# Patient Record
Sex: Female | Born: 1935 | Race: Asian | Hispanic: No | Marital: Single | Smoking: Never smoker
Health system: Southern US, Community
[De-identification: ages and names within clinical notes are randomized; demographics above are authoritative.]

## PROBLEM LIST (undated history)

## (undated) DIAGNOSIS — G629 Polyneuropathy, unspecified: Secondary | ICD-10-CM

## (undated) DIAGNOSIS — F329 Major depressive disorder, single episode, unspecified: Secondary | ICD-10-CM

## (undated) DIAGNOSIS — J45909 Unspecified asthma, uncomplicated: Secondary | ICD-10-CM

## (undated) DIAGNOSIS — J189 Pneumonia, unspecified organism: Secondary | ICD-10-CM

## (undated) DIAGNOSIS — K5900 Constipation, unspecified: Secondary | ICD-10-CM

## (undated) DIAGNOSIS — F32A Depression, unspecified: Secondary | ICD-10-CM

## (undated) DIAGNOSIS — H3500 Unspecified background retinopathy: Secondary | ICD-10-CM

## (undated) DIAGNOSIS — H269 Unspecified cataract: Secondary | ICD-10-CM

## (undated) DIAGNOSIS — N289 Disorder of kidney and ureter, unspecified: Secondary | ICD-10-CM

## (undated) DIAGNOSIS — L0390 Cellulitis, unspecified: Secondary | ICD-10-CM

## (undated) DIAGNOSIS — E785 Hyperlipidemia, unspecified: Secondary | ICD-10-CM

## (undated) DIAGNOSIS — N186 End stage renal disease: Secondary | ICD-10-CM

## (undated) DIAGNOSIS — M1990 Unspecified osteoarthritis, unspecified site: Secondary | ICD-10-CM

## (undated) DIAGNOSIS — E119 Type 2 diabetes mellitus without complications: Secondary | ICD-10-CM

## (undated) DIAGNOSIS — K219 Gastro-esophageal reflux disease without esophagitis: Secondary | ICD-10-CM

## (undated) DIAGNOSIS — D649 Anemia, unspecified: Secondary | ICD-10-CM

## (undated) DIAGNOSIS — I10 Essential (primary) hypertension: Secondary | ICD-10-CM

## (undated) HISTORY — PX: EYE SURGERY: SHX253

---

## 2016-01-21 ENCOUNTER — Encounter (HOSPITAL_COMMUNITY): Payer: Self-pay

## 2016-01-21 ENCOUNTER — Emergency Department (HOSPITAL_COMMUNITY)
Admission: EM | Admit: 2016-01-21 | Discharge: 2016-01-21 | Disposition: A | Discharge status: Home / Self Care | Payer: Medicaid Other | Attending: Emergency Medicine | Admitting: Emergency Medicine

## 2016-01-21 ENCOUNTER — Emergency Department (HOSPITAL_COMMUNITY): Payer: Medicaid Other

## 2016-01-21 DIAGNOSIS — E11319 Type 2 diabetes mellitus with unspecified diabetic retinopathy without macular edema: Secondary | ICD-10-CM | POA: Insufficient documentation

## 2016-01-21 DIAGNOSIS — M549 Dorsalgia, unspecified: Secondary | ICD-10-CM

## 2016-01-21 DIAGNOSIS — E1140 Type 2 diabetes mellitus with diabetic neuropathy, unspecified: Secondary | ICD-10-CM | POA: Insufficient documentation

## 2016-01-21 DIAGNOSIS — Z79899 Other long term (current) drug therapy: Secondary | ICD-10-CM | POA: Insufficient documentation

## 2016-01-21 DIAGNOSIS — Z992 Dependence on renal dialysis: Secondary | ICD-10-CM | POA: Insufficient documentation

## 2016-01-21 DIAGNOSIS — I120 Hypertensive chronic kidney disease with stage 5 chronic kidney disease or end stage renal disease: Secondary | ICD-10-CM | POA: Insufficient documentation

## 2016-01-21 DIAGNOSIS — E1122 Type 2 diabetes mellitus with diabetic chronic kidney disease: Secondary | ICD-10-CM | POA: Insufficient documentation

## 2016-01-21 DIAGNOSIS — R51 Headache: Secondary | ICD-10-CM | POA: Insufficient documentation

## 2016-01-21 DIAGNOSIS — R519 Headache, unspecified: Secondary | ICD-10-CM

## 2016-01-21 DIAGNOSIS — I159 Secondary hypertension, unspecified: Secondary | ICD-10-CM

## 2016-01-21 DIAGNOSIS — M542 Cervicalgia: Secondary | ICD-10-CM | POA: Insufficient documentation

## 2016-01-21 DIAGNOSIS — N186 End stage renal disease: Secondary | ICD-10-CM | POA: Insufficient documentation

## 2016-01-21 HISTORY — DX: Type 2 diabetes mellitus without complications: E11.9

## 2016-01-21 HISTORY — DX: Depression, unspecified: F32.A

## 2016-01-21 HISTORY — DX: Major depressive disorder, single episode, unspecified: F32.9

## 2016-01-21 HISTORY — DX: Unspecified asthma, uncomplicated: J45.909

## 2016-01-21 HISTORY — DX: Gastro-esophageal reflux disease without esophagitis: K21.9

## 2016-01-21 HISTORY — DX: Essential (primary) hypertension: I10

## 2016-01-21 HISTORY — DX: Unspecified cataract: H26.9

## 2016-01-21 HISTORY — DX: Hyperlipidemia, unspecified: E78.5

## 2016-01-21 HISTORY — DX: Anemia, unspecified: D64.9

## 2016-01-21 HISTORY — DX: Constipation, unspecified: K59.00

## 2016-01-21 HISTORY — DX: Cellulitis, unspecified: L03.90

## 2016-01-21 HISTORY — DX: Unspecified osteoarthritis, unspecified site: M19.90

## 2016-01-21 HISTORY — DX: End stage renal disease: N18.6

## 2016-01-21 HISTORY — DX: Polyneuropathy, unspecified: G62.9

## 2016-01-21 HISTORY — DX: Pneumonia, unspecified organism: J18.9

## 2016-01-21 HISTORY — DX: Disorder of kidney and ureter, unspecified: N28.9

## 2016-01-21 HISTORY — DX: Unspecified background retinopathy: H35.00

## 2016-01-21 LAB — CBC WITH DIFFERENTIAL/PLATELET
Basophils Absolute: 0 10*3/uL (ref 0.0–0.1)
Basophils Relative: 0 %
Eosinophils Absolute: 0.2 10*3/uL (ref 0.0–0.7)
Eosinophils Relative: 3 %
HCT: 28.7 % — ABNORMAL LOW (ref 36.0–46.0)
HEMOGLOBIN: 9.8 g/dL — AB (ref 12.0–15.0)
LYMPHS ABS: 1.2 10*3/uL (ref 0.7–4.0)
LYMPHS PCT: 14 %
MCH: 31.9 pg (ref 26.0–34.0)
MCHC: 34.1 g/dL (ref 30.0–36.0)
MCV: 93.5 fL (ref 78.0–100.0)
Monocytes Absolute: 0.6 10*3/uL (ref 0.1–1.0)
Monocytes Relative: 7 %
NEUTROS PCT: 76 %
Neutro Abs: 6.2 10*3/uL (ref 1.7–7.7)
Platelets: 176 10*3/uL (ref 150–400)
RBC: 3.07 MIL/uL — AB (ref 3.87–5.11)
RDW: 12.4 % (ref 11.5–15.5)
WBC: 8.1 10*3/uL (ref 4.0–10.5)

## 2016-01-21 LAB — COMPREHENSIVE METABOLIC PANEL
ALK PHOS: 85 U/L (ref 38–126)
ALT: 11 U/L — AB (ref 14–54)
AST: 21 U/L (ref 15–41)
Albumin: 3.6 g/dL (ref 3.5–5.0)
Anion gap: 10 (ref 5–15)
BUN: 35 mg/dL — AB (ref 6–20)
CALCIUM: 8.7 mg/dL — AB (ref 8.9–10.3)
CO2: 23 mmol/L (ref 22–32)
CREATININE: 5.53 mg/dL — AB (ref 0.44–1.00)
Chloride: 96 mmol/L — ABNORMAL LOW (ref 101–111)
GFR calc non Af Amer: 7 mL/min — ABNORMAL LOW (ref >60)
GFR, EST AFRICAN AMERICAN: 8 mL/min — AB (ref >60)
GLUCOSE: 124 mg/dL — AB (ref 65–99)
Potassium: 4.9 mmol/L (ref 3.5–5.1)
SODIUM: 129 mmol/L — AB (ref 135–145)
Total Bilirubin: 0.6 mg/dL (ref 0.3–1.2)
Total Protein: 6.1 g/dL — ABNORMAL LOW (ref 6.5–8.1)

## 2016-01-21 MED ORDER — METHOCARBAMOL 500 MG PO TABS: 500.0000 mg | ORAL_TABLET | Freq: Three times a day (TID) | ORAL | Status: DC | PRN

## 2016-01-21 MED ORDER — HYDRALAZINE HCL 20 MG/ML IJ SOLN
10.0000 mg | Freq: Once | INTRAMUSCULAR | Status: AC
  Administered 2016-01-21: 10 mg via INTRAVENOUS
  Filled 2016-01-21: qty 1

## 2016-01-21 MED ORDER — LABETALOL HCL 5 MG/ML IV SOLN
10.0000 mg | Freq: Once | INTRAVENOUS | Status: AC
  Administered 2016-01-21: 10 mg via INTRAVENOUS
  Filled 2016-01-21: qty 4

## 2016-01-21 MED ORDER — ACETAMINOPHEN 325 MG PO TABS
650.0000 mg | ORAL_TABLET | Freq: Once | ORAL | Status: AC
  Administered 2016-01-21: 650 mg via ORAL
  Filled 2016-01-21: qty 2

## 2016-01-21 MED ORDER — METHOCARBAMOL 500 MG PO TABS: 500.0000 mg | ORAL_TABLET | Freq: Three times a day (TID) | ORAL | Status: AC | PRN

## 2016-01-21 MED ORDER — METHOCARBAMOL 500 MG PO TABS
500.0000 mg | ORAL_TABLET | Freq: Once | ORAL | Status: AC
  Administered 2016-01-21: 500 mg via ORAL
  Filled 2016-01-21: qty 1

## 2016-01-21 NOTE — ED Provider Notes (Signed)
CSN: [id number]     Arrival date & time 01/21/16  1236 History   First MD Initiated Contact with Patient 01/21/16 1251     Chief Complaint  Patient presents with  . Hypertension     (Consider location/radiation/quality/duration/timing/severity/associated sxs/prior Treatment) Patient is a 81 y.o. female presenting with back pain.  Back Pain Pain location: paraspinal and paracervical. Quality:  Aching Radiates to:  Does not radiate Pain severity:  Mild Onset quality:  Gradual Chronicity:  New Relieved by:  None tried Worsened by:  Nothing tried Ineffective treatments:  None tried Associated symptoms: no fever     Past Medical History  Diagnosis Date  . Hypertension   . Neuropathy (HCC)   . Anemia   . Arthritis   . GERD (gastroesophageal reflux disease)   . Hyperlipidemia   . Cataract   . Diabetes mellitus without complication (HCC)   . Renal disorder   . ESRD (end stage renal disease) (HCC)   . Constipation   . Depression   . Pneumonia   . Asthma   . Cellulitis   . Retinopathy    Past Surgical History  Procedure Laterality Date  . Eye surgery     History reviewed. No pertinent family history. Social History  Substance Use Topics  . Smoking status: Never Smoker   . Smokeless tobacco: None  . Alcohol Use: None   OB History    No data available     Review of Systems  Constitutional: Negative for fever and chills.  Eyes: Negative for pain.  Musculoskeletal: Positive for myalgias, back pain and neck pain.  All other systems reviewed and are negative.     Allergies  Review of patient's allergies indicates no known allergies.  Home Medications   Prior to Admission medications   Medication Sig Start Date End Date Taking? Authorizing Provider  amLODipine (NORVASC) 10 MG tablet Take 10 mg by mouth daily.    Historical Provider, MD  hydrALAZINE (APRESOLINE) 50 MG tablet Take 100 mg by mouth 3 (three) times daily.    Historical Provider, MD  labetalol  (NORMODYNE) 300 MG tablet Take 300 mg by mouth 2 (two) times daily.    Historical Provider, MD  lisinopril (PRINIVIL,ZESTRIL) 20 MG tablet Take 20 mg by mouth 2 (two) times daily.    Historical Provider, MD  multivitamin (RENA-VIT) TABS tablet Take 1 tablet by mouth daily.    Historical Provider, MD   BP 201/67 mmHg  Pulse 70  Temp(Src) 98.4 F (36.9 C) (Oral)  Resp 21  SpO2 96% Physical Exam  Constitutional: She is oriented to person, place, and time. She appears well-developed and well-nourished.  HENT:  Head: Normocephalic and atraumatic.  Neck: Normal range of motion.  Cardiovascular: Normal rate and regular rhythm.   Pulmonary/Chest: Effort normal. No stridor. No respiratory distress.  Abdominal: Soft. Bowel sounds are normal. She exhibits no distension. There is no tenderness.  Musculoskeletal: She exhibits tenderness (trapezius and paraspinal).  Neurological: She is alert and oriented to person, place, and time.  Skin: Skin is warm and dry. No erythema.  Nursing note and vitals reviewed.   ED Course  Procedures (including critical care time) Labs Review Labs Reviewed  CBC WITH DIFFERENTIAL/PLATELET - Abnormal; Notable for the following:    RBC 3.07 (*)    Hemoglobin 9.8 (*)    HCT 28.7 (*)    All other components within normal limits  COMPREHENSIVE METABOLIC PANEL - Abnormal; Notable for the following:    Sodium 129 (*)  Chloride 96 (*)    Glucose, Bld 124 (*)    BUN 35 (*)    Creatinine, Ser 5.53 (*)    Calcium 8.7 (*)    Total Protein 6.1 (*)    ALT 11 (*)    GFR calc non Af Amer 7 (*)    GFR calc Af Amer 8 (*)    All other components within normal limits    Imaging Review Ct Head Wo Contrast  01/21/2016  CLINICAL DATA:  Posterior headache for 2 days. Evaluate for intracranial hemorrhage EXAM: CT HEAD WITHOUT CONTRAST TECHNIQUE: Contiguous axial images were obtained from the base of the skull through the vertex without intravenous contrast. COMPARISON:   None. FINDINGS: Brain: No evidence of acute infarction, hemorrhage, hydrocephalus, or mass lesion/mass effect. Mild, age congruent chronic microvascular disease seen in the cerebral white matter. Normal cerebral volume for age. Vascular: No hyperdense vessel or unexpected calcification. Skull: Negative for fracture or focal lesion. Sinuses/Orbits: No sinusitis or mastoiditis. Left cataract resection. Other: None. IMPRESSION: Unremarkable head CT for age. Electronically Signed   By: Marnee SpringJonathon  Watts M.D.   On: 01/21/2016 14:06   I have personally reviewed and evaluated these images and lab results as part of my medical decision-making.   EKG Interpretation   Date/Time:  Wednesday January 21 2016 12:39:38 EDT Ventricular Rate:  66 PR Interval:    QRS Duration: 128 QT Interval:  457 QTC Calculation: 479 R Axis:   104 Text Interpretation:  Sinus rhythm RBBB and LPFB no previous tracings to  compare Confirmed by [facility] MD, Barbara CowerJASON [phone] on 01/21/2016 1:16:17 PM      MDM   Final diagnoses:  Neck pain  Bilateral back pain, unspecified location  Nonintractable headache, unspecified chronicity pattern, unspecified headache type  Secondary hypertension, unspecified   After speaking with her through an interpreter, patient had some kind of shot a couple days ago and since then has had a posterior headache with paracervical and upper thoracic paraspinal pain and ttp. BP slightly elevated as well.  Suspect muscular source for her back pain, however ct and labs done as well. Will work on bp and see if that helps as well.  Doubt dissection or acs currently i think it is more muscular.  After treatment patient's headache as 100% resolved. I feel is likely muscular. Still some elevated blood pressures by review of med list it is obvious she has historically difficult to control hypertension and is probably not related to her symptoms here today.  Patient will be discharged to follow-up with dialysis to get  dialysis when needed and will also continue taking her blood pressure medications at home and follow-up with her primary doctor for further control of her blood pressure.  New Prescriptions: New Prescriptions   No medications on file     I have personally and contemperaneously reviewed labs and imaging and used in my decision making as above.   A medical screening exam was performed and I feel the patient has had an appropriate workup for their chief complaint at this time and likelihood of emergent condition existing is low and thus workup can continue on an outpatient basis.. Their vital signs are stable. They have been counseled on decision, discharge, follow up and which symptoms necessitate immediate return to the emergency department.  They verbally stated understanding and agreement with plan and discharged in stable condition.      Marily MemosJason Clarance Bollard, MD 01/21/16 [phone]

## 2016-01-21 NOTE — ED Notes (Signed)
Pt presents with 2 day h/o headache, pt points to back of head to location of pain.  Pt was at dialysis center, did not receive treatment due to blood pressure.

## 2016-01-29 ENCOUNTER — Telehealth: Payer: Self-pay | Admitting: *Deleted

## 2016-01-29 NOTE — ED Notes (Signed)
Per patient request Robaxin 500mg  1 tab q8hrs PRN for muscle spasms, #10 called  [facility] [phone]

## 2017-05-09 IMAGING — CT CT HEAD W/O CM
3 of 4 series · 17 of 47 positions shown, 20 images · non-contrast
Comparison: None.

CLINICAL DATA: Posterior headache for 2 days. Evaluate for
intracranial hemorrhage

EXAM:
CT HEAD WITHOUT CONTRAST
TECHNIQUE: Contiguous axial images were obtained from the base of the skull
through the vertex without intravenous contrast.

[Series 201: head w/o, idose (1) · axial · non-contrast · 0.49mm/px · z∈[+75,+195]mm · 11 of 30 slices shown, 14 images]
[im 3/30  brain]
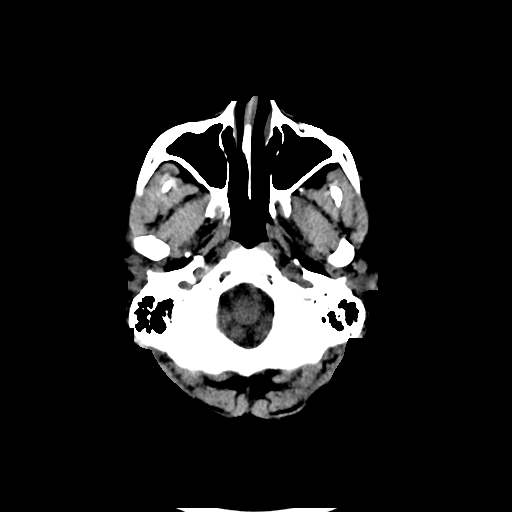
[im 3/30  bone]
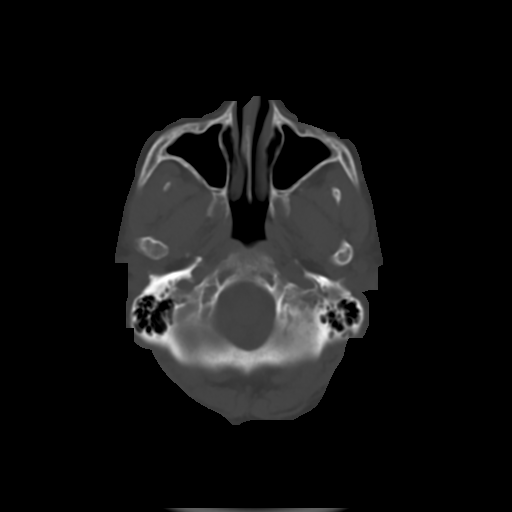
[im 5/30  brain]
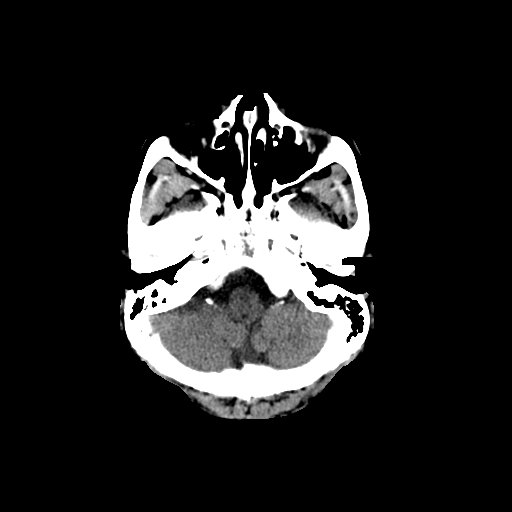
[im 7/30  brain]
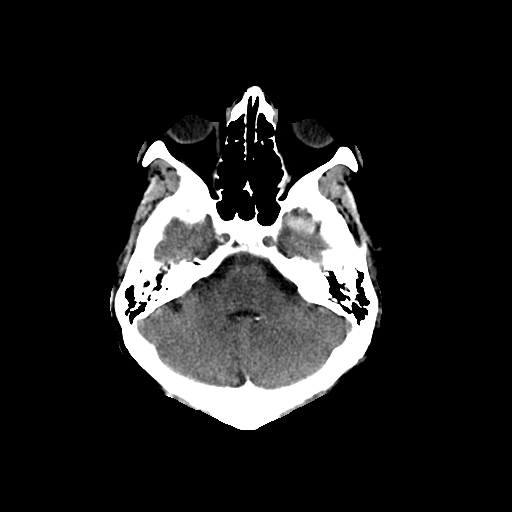
[im 11/30  brain]
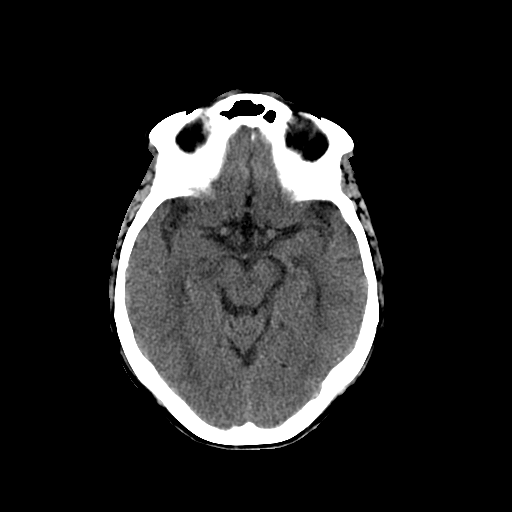
[im 13/30  brain]
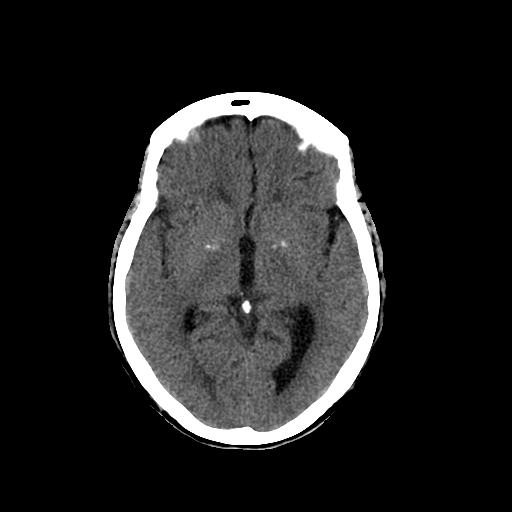
[im 13/30  bone]
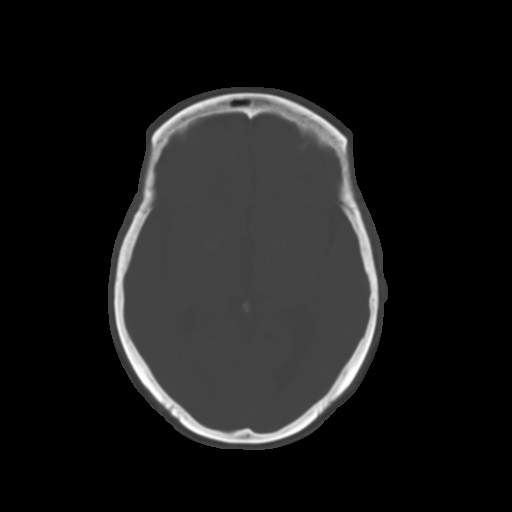
[im 15/30  brain]
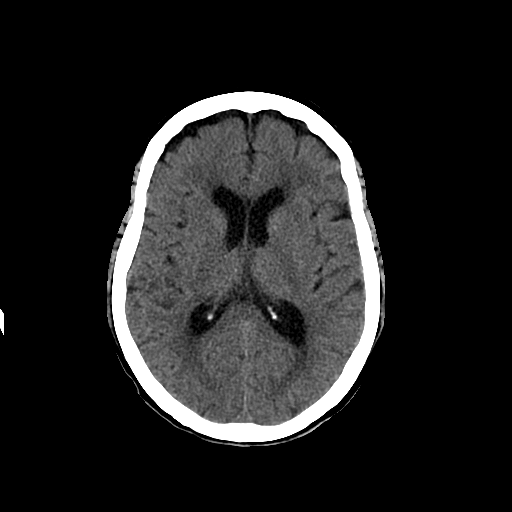
[im 17/30  brain]
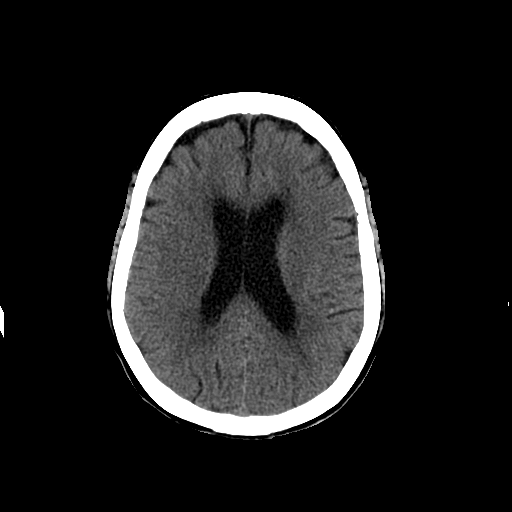
[im 19/30  brain]
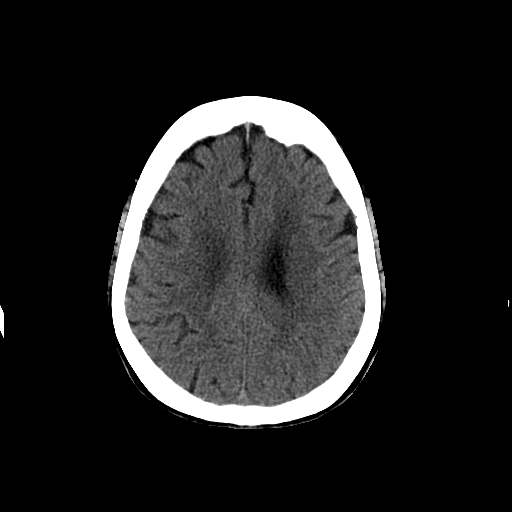
[im 23/30  brain]
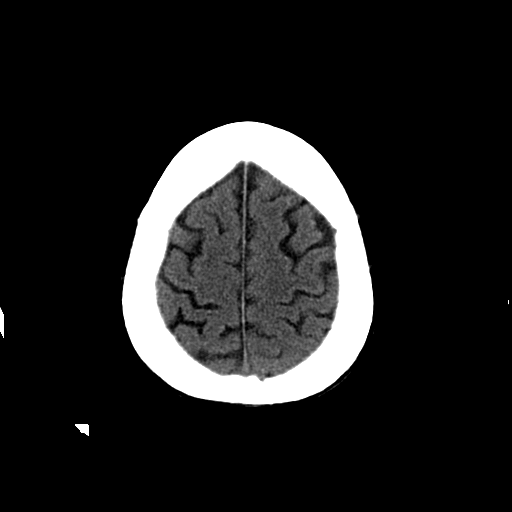
[im 23/30  bone]
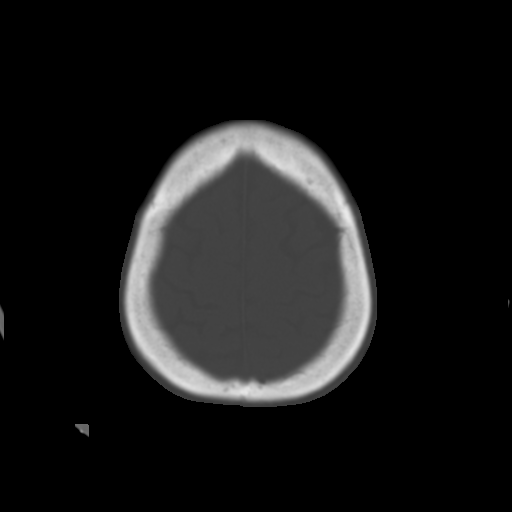
[im 25/30  brain]
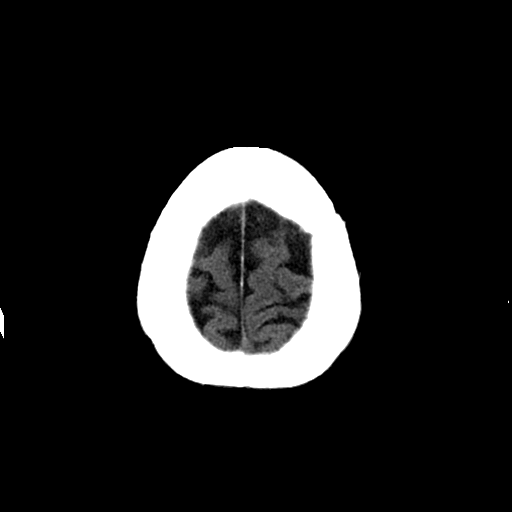
[im 27/30  brain]
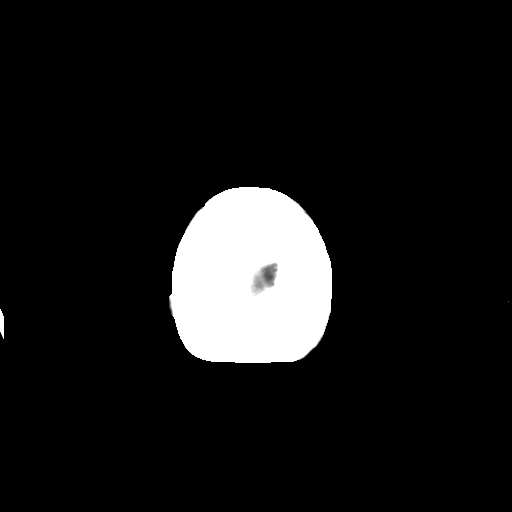

[Series 203: coronal st, idose (1) · coronal · 0.40mm/px · 3 of 75 slices shown]
[im 25/75  brain]
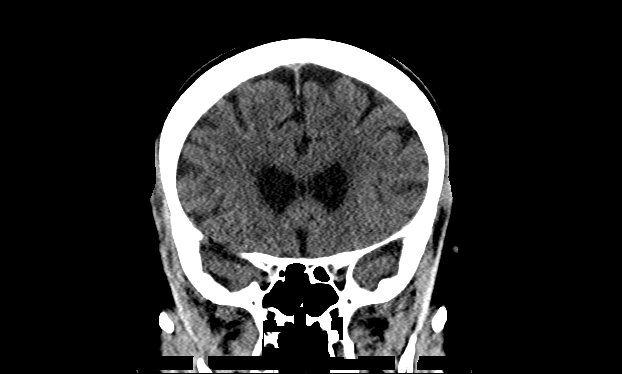
[im 33/75  brain]
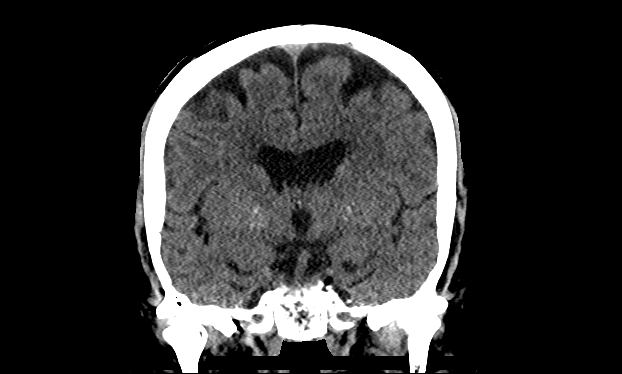
[im 42/75  brain]
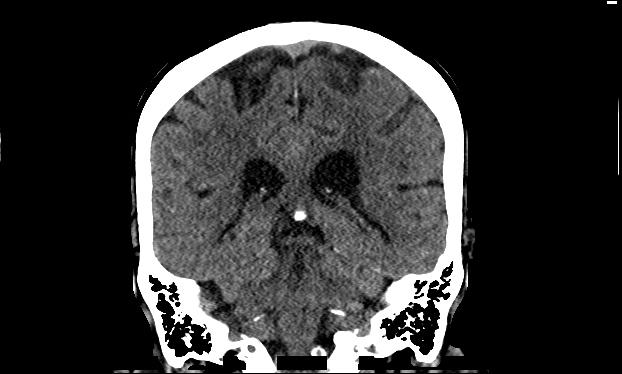

[Series 204: sagittal st, idose (1) · sagittal · 0.40mm/px · 3 of 82 slices shown]
[im 28/82  brain]
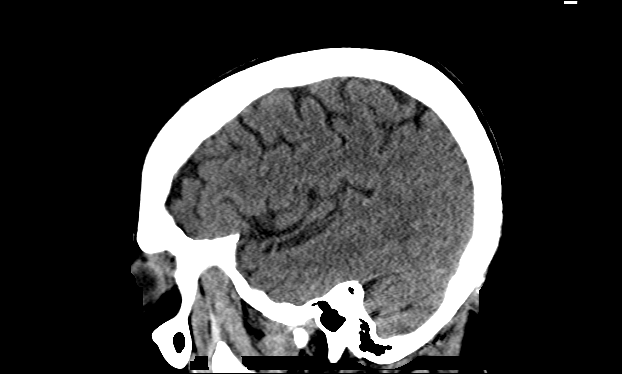
[im 41/82  brain]
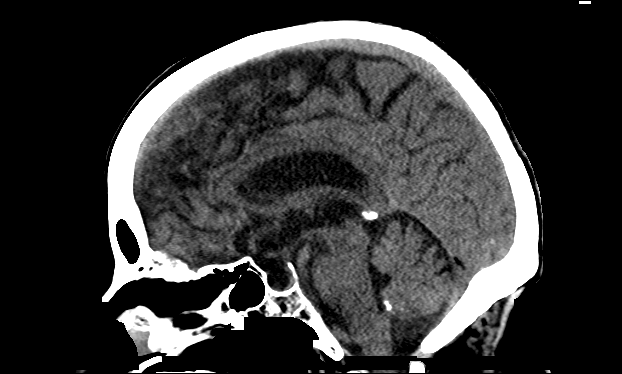
[im 55/82  brain]
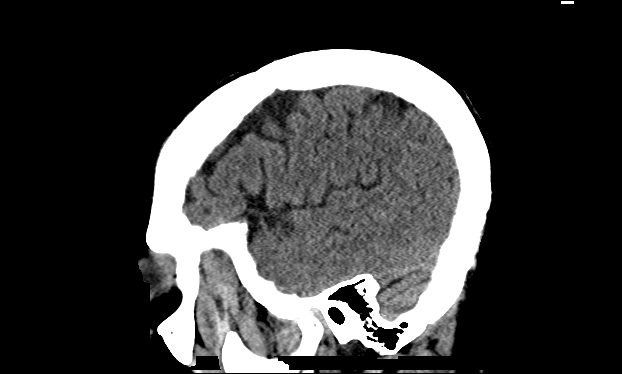

[17 of 47 positions shown; findings below may reference images not displayed]

FINDINGS: Brain: No evidence of acute infarction, hemorrhage, hydrocephalus,
or mass lesion/mass effect. Mild, age congruent chronic
microvascular disease seen in the cerebral white matter. Normal
cerebral volume for age.

Vascular: No hyperdense vessel or unexpected calcification.

Skull: Negative for fracture or focal lesion.

Sinuses/Orbits: No sinusitis or mastoiditis. Left cataract
resection.

Other: None.
IMPRESSION: Unremarkable head CT for age.
